# Patient Record
Sex: Male | Born: 1990 | Race: White | Hispanic: No | Marital: Married | Smoking: Never smoker
Health system: Southern US, Community
[De-identification: ages and names within clinical notes are randomized; demographics above are authoritative.]

## PROBLEM LIST (undated history)

## (undated) DIAGNOSIS — I10 Essential (primary) hypertension: Secondary | ICD-10-CM

## (undated) HISTORY — DX: Essential (primary) hypertension: I10

---

## 2009-04-07 ENCOUNTER — Emergency Department: Payer: Self-pay | Admitting: Emergency Medicine

## 2009-12-14 IMAGING — CT CT STONE STUDY
1 of 2 series · 15 of 32 positions shown, 19 images · non-contrast
Comparison: none

REASON FOR EXAM: R mid abdominal pain
COMMENTS:

[Series 2: stone · axial · 0.65mm/px · z∈[+301,+718]mm · 15 of 157 slices shown, 19 images]
[im 12/157  soft-tissue]
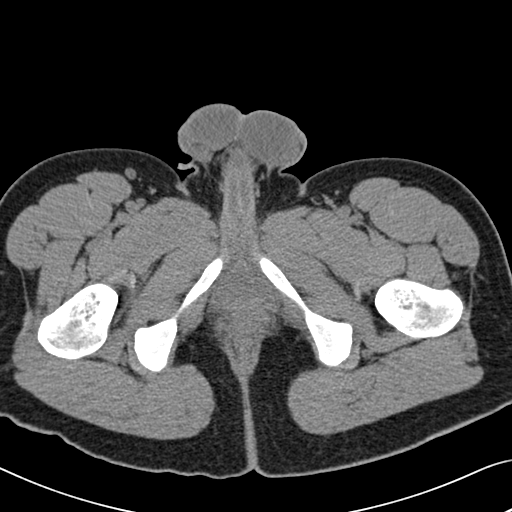
[im 12/157  bone]
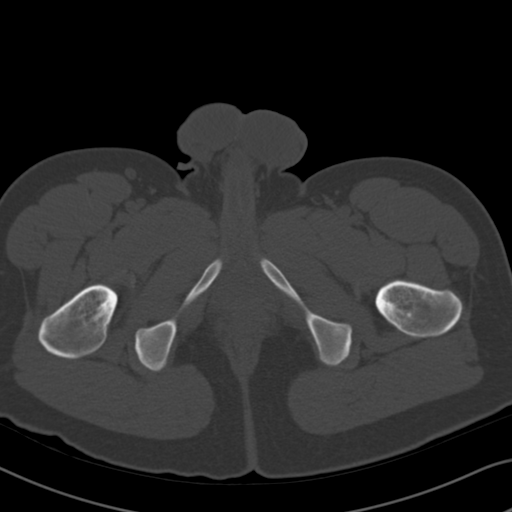
[im 23/157  soft-tissue]
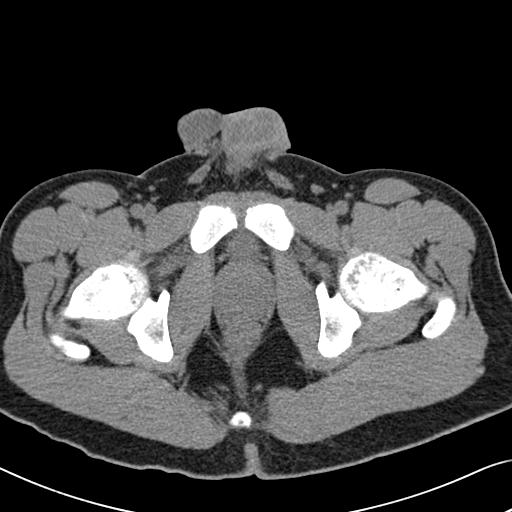
[im 34/157  soft-tissue]
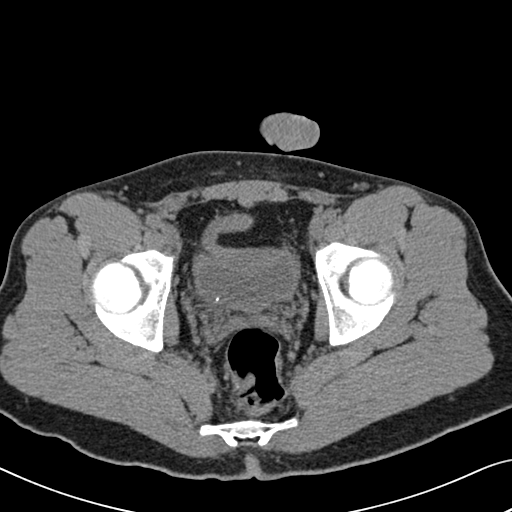
[im 45/157  soft-tissue]
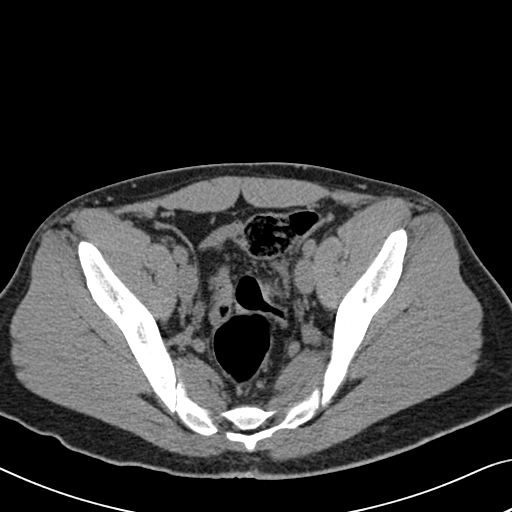
[im 56/157  soft-tissue]
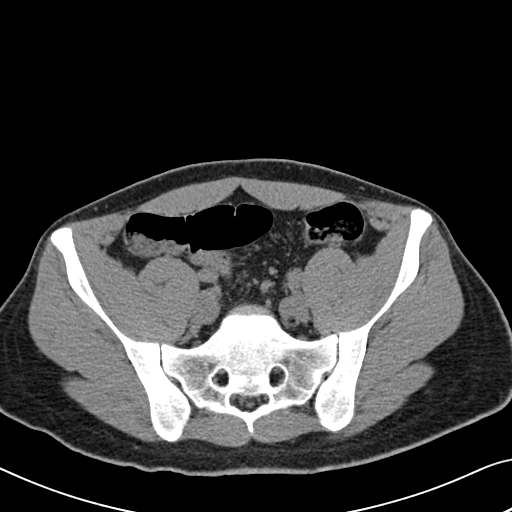
[im 67/157  soft-tissue]
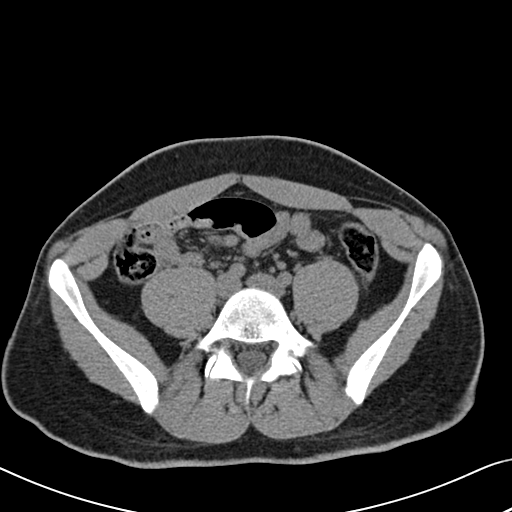
[im 79/157  soft-tissue]
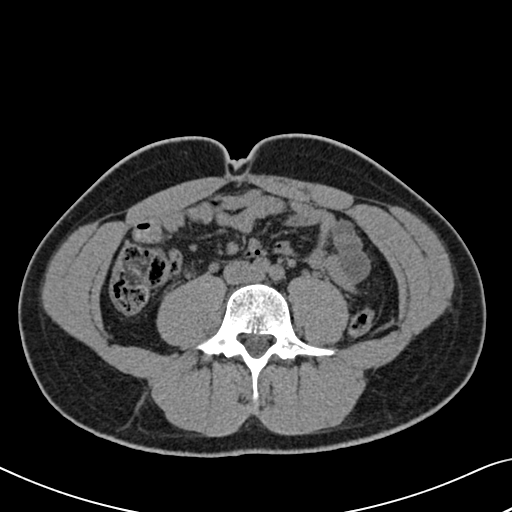
[im 90/157  soft-tissue]
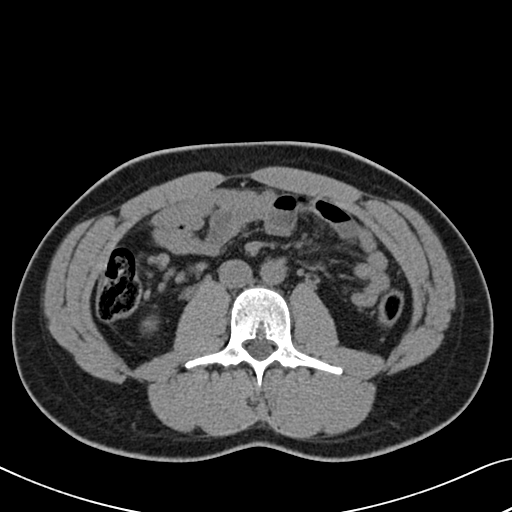
[im 101/157  soft-tissue]
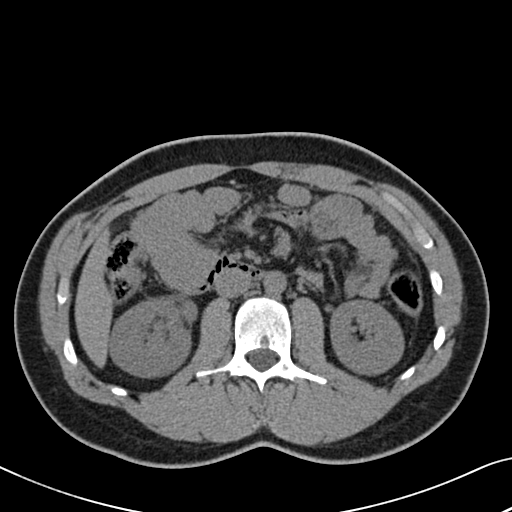
[im 101/157  bone]
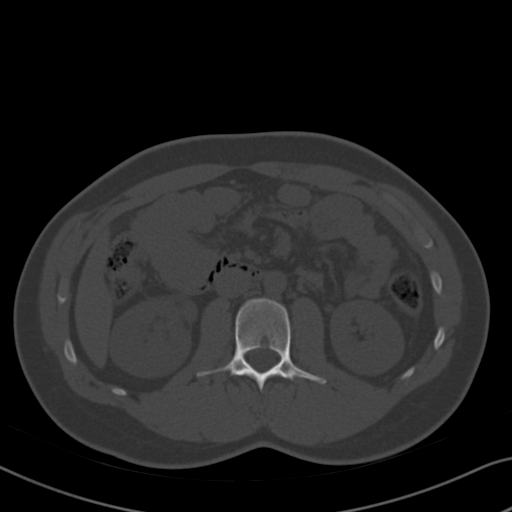
[im 112/157  soft-tissue]
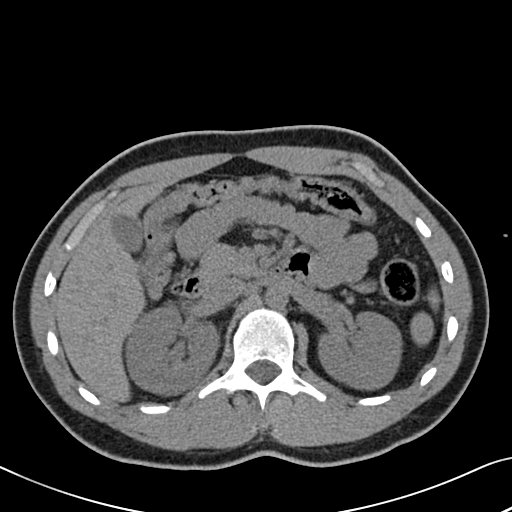
[im 123/157  soft-tissue]
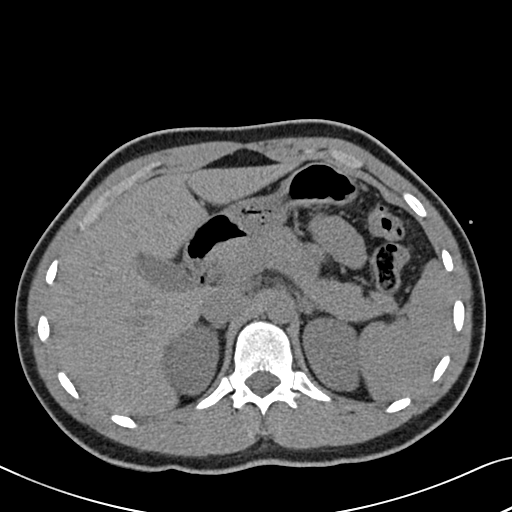
[im 134/157  soft-tissue]
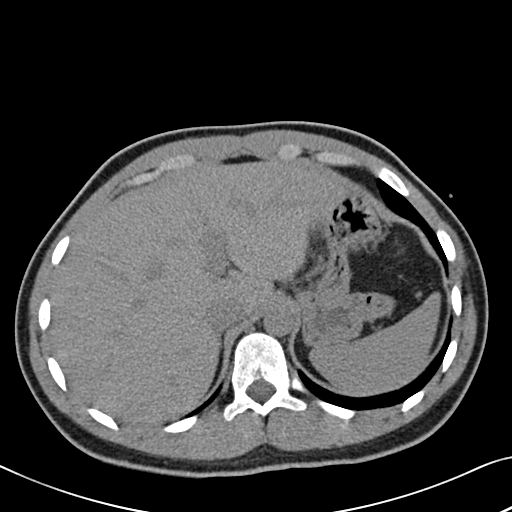
[im 134/157  lung]
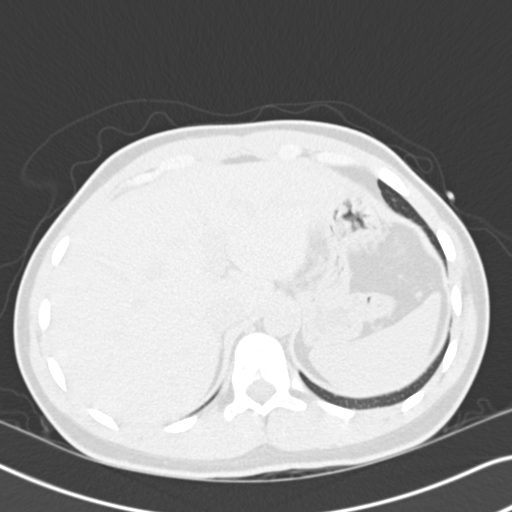
[im 140/157  lung]
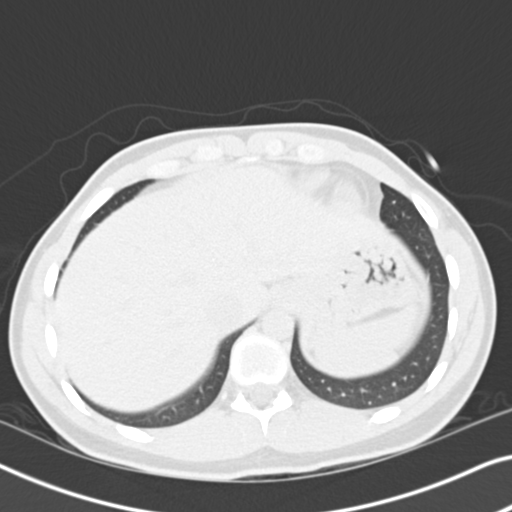
[im 145/157  soft-tissue]
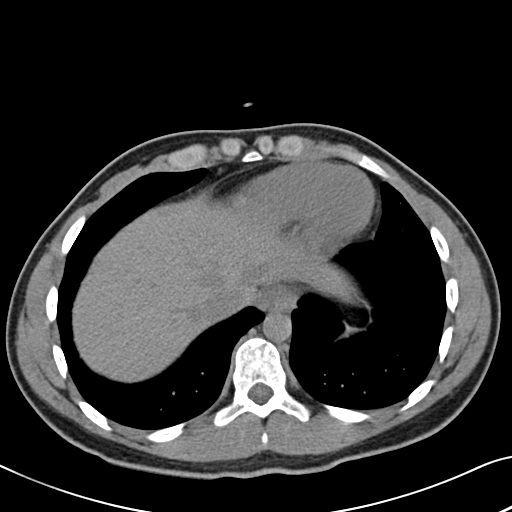
[im 145/157  lung]
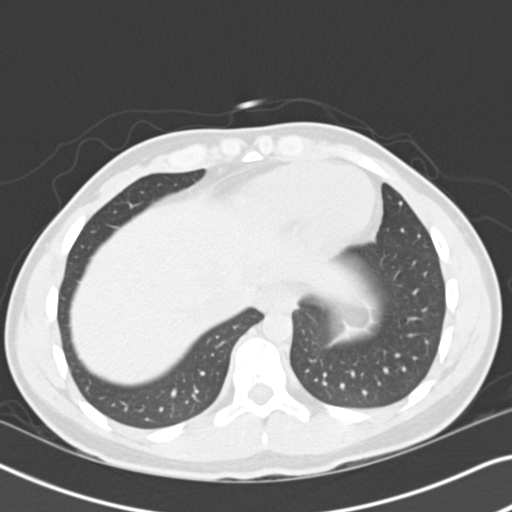
[im 151/157  lung]
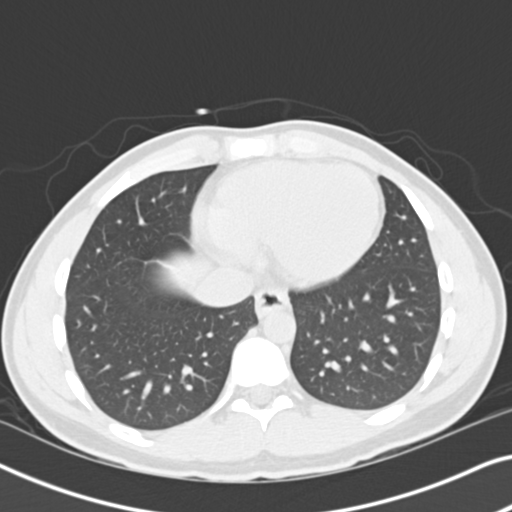

[15 of 32 positions shown; findings below may reference images not displayed]

PROCEDURE:     CT  - CT ABDOMEN /PELVIS WO (STONE)  - April 07, 2009 [DATE]

RESULT:     Helical 3 mm sections were obtained from the lung bases through
the pubic symphysis without the administration of oral or intravenous
contrast per a renal colic protocol.

Evaluation of the lung bases demonstrates no gross abnormalities.

Non-contrast evaluation of the liver, spleen, adrenals, pancreas and LEFT
kidney are unremarkable. There is mild pelviectasis involving the RIGHT
kidney. Mild hydroureter is appreciated to the level of the urinary bladder.
A 2 mm calculus is appreciated just distal to the ureterovesical junction or
possibly within the most distal portion of the ureterovesical junction on
the RIGHT. These findings are consistent with a previous calculus within the
RIGHT system. There is no CT evidence of bowel obstruction nor secondary
signs of enteritis, colitis or diverticulitis nor an abdominal aortic
aneurysm.
IMPRESSION: 1. Findings consistent with a RIGHT renal calculus which is likely just
distal to the ureterovesical junction on the RIGHT or possibly within the
most distal portion of the junction. There is minimal residual obstructive
uropathy.

Dr. Awa of the Emergency Department was informed of these findings at
the time of the initial interpretation.

## 2016-05-08 DIAGNOSIS — I10 Essential (primary) hypertension: Secondary | ICD-10-CM | POA: Insufficient documentation

## 2016-09-04 ENCOUNTER — Encounter: Payer: Self-pay | Admitting: Interventional Cardiology

## 2016-09-19 ENCOUNTER — Ambulatory Visit: Payer: Self-pay | Admitting: Interventional Cardiology

## 2016-09-25 ENCOUNTER — Encounter: Payer: Self-pay | Admitting: Interventional Cardiology

## 2016-09-27 ENCOUNTER — Encounter: Payer: Self-pay | Admitting: Interventional Cardiology

## 2016-10-22 DIAGNOSIS — E782 Mixed hyperlipidemia: Secondary | ICD-10-CM | POA: Insufficient documentation

## 2016-12-04 DIAGNOSIS — R748 Abnormal levels of other serum enzymes: Secondary | ICD-10-CM | POA: Insufficient documentation

## 2018-08-04 ENCOUNTER — Encounter: Payer: Self-pay | Admitting: Emergency Medicine

## 2018-08-04 ENCOUNTER — Emergency Department
Admission: EM | Admit: 2018-08-04 | Discharge: 2018-08-05 | Disposition: A | Discharge status: Home / Self Care | Payer: BC Managed Care – PPO | Attending: Student in an Organized Health Care Education/Training Program | Admitting: Student in an Organized Health Care Education/Training Program

## 2018-08-04 ENCOUNTER — Other Ambulatory Visit: Payer: Self-pay

## 2018-08-04 DIAGNOSIS — R002 Palpitations: Secondary | ICD-10-CM | POA: Diagnosis present

## 2018-08-04 DIAGNOSIS — I10 Essential (primary) hypertension: Secondary | ICD-10-CM | POA: Insufficient documentation

## 2018-08-04 LAB — CBC
HCT: 46 % (ref 40.0–52.0)
Hemoglobin: 16.8 g/dL (ref 13.0–18.0)
MCH: 35.3 pg — ABNORMAL HIGH (ref 26.0–34.0)
MCHC: 36.6 g/dL — AB (ref 32.0–36.0)
MCV: 96.6 fL (ref 80.0–100.0)
Platelets: 258 10*3/uL (ref 150–440)
RBC: 4.76 MIL/uL (ref 4.40–5.90)
RDW: 12.3 % (ref 11.5–14.5)
WBC: 8.3 10*3/uL (ref 3.8–10.6)

## 2018-08-04 LAB — BASIC METABOLIC PANEL
Anion gap: 9 (ref 5–15)
BUN: 10 mg/dL (ref 6–20)
CO2: 31 mmol/L (ref 22–32)
CREATININE: 0.96 mg/dL (ref 0.61–1.24)
Calcium: 10.3 mg/dL (ref 8.9–10.3)
Chloride: 98 mmol/L (ref 98–111)
GFR calc Af Amer: >60 mL/min (ref >60)
Glucose, Bld: 104 mg/dL — ABNORMAL HIGH (ref 70–99)
Potassium: 4.8 mmol/L (ref 3.5–5.1)
Sodium: 138 mmol/L (ref 135–145)

## 2018-08-04 LAB — FIBRIN DERIVATIVES D-DIMER (ARMC ONLY): FIBRIN DERIVATIVES D-DIMER (ARMC): 239.24 ng{FEU}/mL (ref 0.00–499.00)

## 2018-08-04 LAB — TSH: TSH: 4.168 u[IU]/mL (ref 0.350–4.500)

## 2018-08-04 LAB — TROPONIN I: Troponin I: <0.03 ng/mL (ref <0.03)

## 2018-08-04 NOTE — Discharge Instructions (Addendum)
Please follow-up with your doctor.  I have also given you a referral to cardiology, which could be of benefit if you have additional episodes of tachycardia and or symptomatic palpitations.  Return if you have any worsening symptoms or for any additional questions or concerns.

## 2018-08-04 NOTE — ED Triage Notes (Addendum)
Pt presents to ED with intermittent tachycardia for the past 3 days. pt states he has HTN and feels like his blood pressure is up as well due to "feeling thumping" in his head. Denies headache or dizziness. Hx of the same. Symptoms not improving and pt states he was feeling concerned. Pt states he feels chest pressure but can usually pop his back and feel better.

## 2018-08-04 NOTE — ED Provider Notes (Signed)
[facility] Emergency Department Provider Note    First MD Initiated Contact with Patient 08/04/18 2228     (approximate)  I have reviewed the triage vital signs and the nursing notes.   HISTORY  Chief Complaint Tachycardia and Hypertension    HPI Jose Kent is a 27 y.o. male presents to the ER with chief complaint of intermittent episodes where he starts feeling palpitations throbbing sensation in his head as well as sweats.  States he is also having episodes where his wife will wake him up in the middle at night and will be very angry but will not remember the next day.  States he is been very stressed over the past 8 months after the birth of the child.  Denies any chest pains.  No lower extremity swelling.  He is very active and does cardio daily.  Does not want anything for anxiety.  No new medications.  Does have family history of DVT as well as thyroid disorder.    Past Medical History:  Diagnosis Date  . Hypertension    History reviewed. No pertinent family history. History reviewed. No pertinent surgical history. There are no active problems to display for this patient.     Prior to Admission medications   Medication Sig Start Date End Date Taking? Authorizing Provider  lisinopril-hydrochlorothiazide (PRINZIDE,ZESTORETIC) 10-12.5 MG tablet Take 1 tablet by mouth daily. 07/30/18  Yes [provider]    Allergies Patient has no known allergies.    Social History Social History   Tobacco Use  . Smoking status: Never Smoker  . Smokeless tobacco: Never Used  Substance Use Topics  . Alcohol use: Yes  . Drug use: No    Review of Systems Patient denies headaches, rhinorrhea, blurry vision, numbness, shortness of breath, chest pain, edema, cough, abdominal pain, nausea, vomiting, diarrhea, dysuria, fevers, rashes or hallucinations unless otherwise stated above in  HPI. ____________________________________________   PHYSICAL EXAM:  VITAL SIGNS: Vitals:   08/04/18 2118 08/04/18 2230  BP: 130/87 128/84  Pulse: (!) 113 99  Resp: 20 17  Temp: 98.6 F (37 C)   SpO2: 100% 99%    Constitutional: Alert and oriented.  Eyes: Conjunctivae are normal.  Head: Atraumatic. Nose: No congestion/rhinnorhea. Mouth/Throat: Mucous membranes are moist.   Neck: No stridor. Painless ROM.  Cardiovascular: Normal rate, regular rhythm. Grossly normal heart sounds.  Good peripheral circulation. Respiratory: Normal respiratory effort.  No retractions. Lungs CTAB. Gastrointestinal: Soft and nontender. No distention. No abdominal bruits. No CVA tenderness. Genitourinary:  Musculoskeletal: No lower extremity tenderness nor edema.  No joint effusions. Neurologic:  Normal speech and language. No gross focal neurologic deficits are appreciated. No facial droop Skin:  Skin is warm, dry and intact. No rash noted. Psychiatric: Mood and affect are normal. Speech and behavior are normal.  ____________________________________________   LABS (all labs ordered are listed, but only abnormal results are displayed)  Results for orders placed or performed during the hospital encounter of 08/04/18 (from the past 24 hour(s))  Basic metabolic panel     Status: Abnormal   Collection Time: 08/04/18  9:23 PM  Result Value Ref Range   Sodium 138 135 - 145 mmol/L   Potassium 4.8 3.5 - 5.1 mmol/L   Chloride 98 98 - 111 mmol/L   CO2 31 22 - 32 mmol/L   Glucose, Bld 104 (H) 70 - 99 mg/dL   BUN 10 6 - 20 mg/dL   Creatinine, Ser 4.09 0.61 - 1.24  mg/dL   Calcium 04.510.3 8.9 - 40.910.3 mg/dL   GFR calc non Af Amer >60 >60 mL/min   GFR calc Af Amer >60 >60 mL/min   Anion gap 9 5 - 15  CBC     Status: Abnormal   Collection Time: 08/04/18  9:23 PM  Result Value Ref Range   WBC 8.3 3.8 - 10.6 K/uL   RBC 4.76 4.40 - 5.90 MIL/uL   Hemoglobin 16.8 13.0 - 18.0 g/dL   HCT 81.146.0 91.440.0 - 78.252.0 %    MCV 96.6 80.0 - 100.0 fL   MCH 35.3 (H) 26.0 - 34.0 pg   MCHC 36.6 (H) 32.0 - 36.0 g/dL   RDW 95.612.3 21.311.5 - 08.614.5 %   Platelets 258 150 - 440 K/uL  Troponin I     Status: None   Collection Time: 08/04/18  9:23 PM  Result Value Ref Range   Troponin I <0.03 <0.03 ng/mL  Fibrin derivatives D-Dimer ([facility] only)     Status: None   Collection Time: 08/04/18 10:53 PM  Result Value Ref Range   Fibrin derivatives D-dimer ([facility]) 239.24 0.00 - 499.00 ng/mL (FEU)  TSH     Status: None   Collection Time: 08/04/18 10:53 PM  Result Value Ref Range   TSH 4.168 0.350 - 4.500 uIU/mL   ____________________________________________  EKG My review and personal interpretation at Time: 21:16   Indication: palpitations  Rate: 106  Rhythm: sinus Axis: normal  Other: normal intervals, no wpw or brugada ____________________________________________  RADIOLOGY   ____________________________________________   PROCEDURES  Procedure(s) performed:  Procedures    Critical Care performed: no ____________________________________________   INITIAL IMPRESSION / ASSESSMENT AND PLAN / ED COURSE  Pertinent labs & imaging results that were available during my care of the patient were reviewed by me and considered in my medical decision making (see chart for details).   DDX: Dysrhythmia, dehydration, lecture light abnormality, anemia, hyperthyroid, PE, anxiety  Jose Kent is a 27 y.o. who presents to the ED with symptoms as described above.  Blood work is reassuring.  No evidence of hyperthyroid electrolyte or anemia.  EKG shows no evidence of preexcitation syndrome.  Patient low risk by Wells criteria and his d-dimer is negative.  Given his presentation I do believe he stable and appropriate for discharge home.  Clinical Course as of Aug 04 2342  Tue Aug 04, 2018  2234 EKG 12-Lead [PR]    Clinical Course User Index [PR] Willy Eddyobinson, Yasin Ducat, MD     As part of my medical decision making, I  reviewed the following data within the electronic MEDICAL RECORD NUMBER Nursing notes reviewed and incorporated, Labs reviewed, notes from prior ED visits and [place] Controlled Substance Database   ____________________________________________   FINAL CLINICAL IMPRESSION(S) / ED DIAGNOSES  Final diagnoses:  Palpitations      NEW MEDICATIONS STARTED DURING THIS VISIT:  New Prescriptions   No medications on file     Note:  This document was prepared using Dragon voice recognition software and may include unintentional dictation errors.    Willy Eddyobinson, Elleigh Cassetta, MD 08/04/18 [phone]

## 2019-04-15 ENCOUNTER — Telehealth (HOSPITAL_COMMUNITY): Payer: Self-pay | Admitting: Licensed Clinical Social Worker

## 2019-04-15 NOTE — Telephone Encounter (Signed)
PT called at 1PM while counselor was in CD-IOP. Counselor returned call and PT did not answer. Counselor left message asking for a return call tomorrow. PT called to inquire about "substance abuse counseling".

## 2019-06-28 DIAGNOSIS — F331 Major depressive disorder, recurrent, moderate: Secondary | ICD-10-CM | POA: Insufficient documentation

## 2020-02-10 DIAGNOSIS — J310 Chronic rhinitis: Secondary | ICD-10-CM | POA: Insufficient documentation

## 2020-03-29 ENCOUNTER — Ambulatory Visit: Payer: BC Managed Care – PPO | Attending: Otolaryngology

## 2024-08-31 ENCOUNTER — Ambulatory Visit: Payer: Self-pay | Admitting: Family Medicine

## 2024-08-31 ENCOUNTER — Encounter: Payer: Self-pay | Admitting: Family Medicine

## 2024-08-31 VITALS — BP 119/78 | HR 70 | Ht 69.09 in | Wt 145.0 lb

## 2024-08-31 DIAGNOSIS — Z23 Encounter for immunization: Secondary | ICD-10-CM

## 2024-08-31 DIAGNOSIS — E782 Mixed hyperlipidemia: Secondary | ICD-10-CM

## 2024-08-31 DIAGNOSIS — F419 Anxiety disorder, unspecified: Secondary | ICD-10-CM

## 2024-08-31 DIAGNOSIS — Z0001 Encounter for general adult medical examination with abnormal findings: Secondary | ICD-10-CM | POA: Diagnosis not present

## 2024-08-31 DIAGNOSIS — S6991XA Unspecified injury of right wrist, hand and finger(s), initial encounter: Secondary | ICD-10-CM

## 2024-08-31 DIAGNOSIS — F331 Major depressive disorder, recurrent, moderate: Secondary | ICD-10-CM

## 2024-08-31 DIAGNOSIS — Z136 Encounter for screening for cardiovascular disorders: Secondary | ICD-10-CM

## 2024-08-31 DIAGNOSIS — Z130 Encounter for screening for diseases of the blood and blood-forming organs and certain disorders involving the immune mechanism: Secondary | ICD-10-CM

## 2024-08-31 DIAGNOSIS — Z0000 Encounter for general adult medical examination without abnormal findings: Secondary | ICD-10-CM

## 2024-08-31 MED ORDER — ESCITALOPRAM OXALATE 10 MG PO TABS: 10.0000 mg | ORAL_TABLET | Freq: Every day | ORAL | 1 refills | Status: AC

## 2024-08-31 NOTE — Progress Notes (Signed)
 New patient visit   Patient: Jose Kent   DOB: [birth year]   33 y.o. Male  MRN: [id number] Visit Date: 08/31/2024  Today's healthcare provider: LAURAINE LOISE BUOY, DO   Chief Complaint  Patient presents with   New Patient (Initial Visit)    No concerns    Subjective    Jose Kent is a 33 y.o. male who presents today as a new patient to establish care.  HPI HPI     New Patient (Initial Visit)    Additional comments: No concerns       Last edited by Thelbert Eulalio HERO, CMA on 08/31/2024  9:11 AM.       Jose Kent is a 33 year old male who presents with anxiety and depression.  He has a history of anxiety and depression, previously managed with sertraline, which he discontinued due to experiencing 'brain zaps'. Since stopping the medication, he has managed without it, but symptoms have recurred. Over the past two weeks, he feels 'down, depressed, or hopeless' most days and has trouble falling asleep. His wife has noticed a lack of enthusiasm and that he seems 'out of it'. He experiences panic attacks about twice a year, if that.  In the last week, they have started ascending, but he acknowledges that the symptoms started prior to that.  He has a history of high blood pressure and high cholesterol, neither of which are currently being treated with medication. He previously managed high cholesterol with dietary changes. He lost a significant amount of weight after ceasing alcohol consumption, attributing this to the high caloric content of alcohol.  He sustained a wrist injury from a fall on his outstretched hand at the beginning of the summer, approximately two months ago, which continues to cause pain, particularly during push-ups or dips. The pain is localized to the medial proximal palm/wrist.  He has a history of elevated liver enzymes and attended a rehabilitation facility four years ago.  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     Past Medical History:  Diagnosis Date   Essential hypertension 05/08/2016   Improved off meds post wt loss     Hypertension    History reviewed. No pertinent surgical history. Family Status  Relation Name Status   Mother  Alive   Father  Alive   Brother  Alive  No partnership data on file   History reviewed. No pertinent family history. Social History   Socioeconomic History   Marital status: Married    Spouse name: Not on file   Number of children: 0   Years of education: COLLEGE   Highest education level: Not on file  Occupational History   Occupation: TEACHER  Tobacco Use   Smoking status: Never   Smokeless tobacco: Never  Vaping Use   Vaping status: Never Used  Substance and Sexual Activity   Alcohol use: Yes   Drug use: No   Sexual activity: Yes  Other Topics Concern   Not on file  Social History Narrative   Not on file   Social Drivers of Health   Financial Resource Strain: Low Risk  (08/31/2024)   Overall Financial Resource Strain (CARDIA)    Difficulty of Paying Living Expenses: Not hard at all  Food Insecurity: No Food Insecurity (08/31/2024)   Hunger Vital Sign    Worried About Running Out of Food in the Last Year: Never true  Ran Out of Food in the Last Year: Never true  Transportation Needs: No Transportation Needs (08/31/2024)   PRAPARE - Administrator, Civil Service (Medical): No    Lack of Transportation (Non-Medical): No  Physical Activity: Not on file  Stress: Stress Concern Present (08/31/2024)   [employer] of Occupational Health - Occupational Stress Questionnaire    Feeling of Stress: To some extent  Social Connections: Not on file   Outpatient Medications Prior to Visit  Medication Sig Note   [DISCONTINUED] lisinopril-hydrochlorothiazide (PRINZIDE,ZESTORETIC) 10-12.5 MG tablet Take 1 tablet by mouth daily. (Patient not taking: Reported on 08/31/2024)  08/31/2024: BP controlled s/p weight loss   No facility-administered medications prior to visit.   No Known Allergies  Immunization History  Administered Date(s) Administered   Influenza, Seasonal, Injecte, Preservative Fre 08/31/2024   Influenza-Unspecified 11/03/2017, 09/25/2023   PFIZER Comirnaty(Gray Top)Covid-19 Tri-Sucrose Vaccine 01/31/2020, 02/21/2020, 12/28/2020   PPD Test 10/11/2022   Tdap 04/06/2016, 11/03/2017    Health Maintenance  Topic Date Due   HPV VACCINES (1 - 3-dose SCDM series) 08/31/2025 (Originally 10/27/2016)   COVID-19 Vaccine (7 - 2025-26 season) 09/16/2025 (Originally 08/30/2024)   DTaP/Tdap/Td (3 - Td or Tdap) 11/04/2027   INFLUENZA VACCINE  Completed   Pneumococcal Vaccine  Aged Out   Meningococcal B Vaccine  Aged Out   Hepatitis B Vaccines 19-59 Average Risk  Discontinued   Hepatitis C Screening  Discontinued   HIV Screening  Discontinued    Patient Care Team: Donzella Lauraine SAILOR, DO as PCP - General (Family Medicine)  Review of Systems  Constitutional:  Negative for appetite change, chills, fatigue and fever.  HENT:  Negative for congestion, ear pain, hearing loss, nosebleeds and trouble swallowing.   Eyes:  Negative for pain and visual disturbance.  Respiratory:  Negative for cough, chest tightness and shortness of breath.   Cardiovascular:  Negative for chest pain, palpitations and leg swelling.  Gastrointestinal:  Negative for abdominal pain, blood in stool, constipation, diarrhea, nausea and vomiting.  Endocrine: Negative for polydipsia, polyphagia and polyuria.  Genitourinary:  Negative for dysuria and flank pain.  Musculoskeletal:  Positive for arthralgias (right wrist pain s/p FOOSH injury 2 months ago). Negative for back pain, joint swelling, myalgias and neck stiffness.  Skin:  Negative for color change, rash and wound.  Neurological:  Negative for dizziness, tremors, seizures, speech difficulty, weakness, light-headedness and headaches.   Psychiatric/Behavioral:  Negative for behavioral problems, confusion, decreased concentration, dysphoric mood and sleep disturbance. The patient is not nervous/anxious.   All other systems reviewed and are negative.       Objective    BP 119/78 (BP Location: Left Arm, Patient Position: Sitting, Cuff Size: Normal)   Pulse 70   Ht 5' 9.09 (1.755 m)   Wt 145 lb (65.8 kg)   SpO2 100%   BMI 21.35 kg/m     Physical Exam Vitals and nursing note reviewed.  Constitutional:      General: He is awake.     Appearance: Normal appearance.  HENT:     Head: Normocephalic and atraumatic.     Right Ear: Tympanic membrane, ear canal and external ear normal.     Left Ear: Tympanic membrane, ear canal and external ear normal.     Nose: Nose normal.     Mouth/Throat:     Mouth: Mucous membranes are moist.     Pharynx: Oropharynx is clear. No oropharyngeal exudate or posterior oropharyngeal erythema.  Eyes:  General: No scleral icterus.    Extraocular Movements: Extraocular movements intact.     Conjunctiva/sclera: Conjunctivae normal.     Pupils: Pupils are equal, round, and reactive to light.  Neck:     Thyroid : No thyromegaly or thyroid  tenderness.  Cardiovascular:     Rate and Rhythm: Normal rate and regular rhythm.     Pulses: Normal pulses.     Heart sounds: Normal heart sounds.  Pulmonary:     Effort: Pulmonary effort is normal. No tachypnea, bradypnea or respiratory distress.     Breath sounds: Normal breath sounds. No stridor. No wheezing, rhonchi or rales.  Abdominal:     General: Bowel sounds are normal. There is no distension.     Palpations: Abdomen is soft. There is no mass.     Tenderness: There is no abdominal tenderness. There is no guarding.     Hernia: No hernia is present.  Musculoskeletal:       Arms:     Cervical back: Normal range of motion and neck supple.     Right lower leg: No edema.     Left lower leg: No edema.     Comments: Pain in wrist/proximal  hand approximately overlying the lunate bone  Lymphadenopathy:     Cervical: No cervical adenopathy.  Skin:    General: Skin is warm and dry.  Neurological:     Mental Status: He is alert and oriented to person, place, and time. Mental status is at baseline.  Psychiatric:        Mood and Affect: Mood normal.        Behavior: Behavior normal.     Depression Screen    08/31/2024    9:25 AM  PHQ 2/9 Scores  PHQ - 2 Score 6  PHQ- 9 Score 15   No results found for any visits on 08/31/24.  Assessment & Plan     Encounter for medical examination to establish care  Recurrent moderate major depressive disorder with anxiety (HCC) -     Escitalopram  Oxalate; Take 1 tablet (10 mg total) by mouth daily.  Dispense: 30 tablet; Refill: 1  Right wrist injury, initial encounter -     DG Wrist Complete Right; Future  Moderate mixed hyperlipidemia not requiring statin therapy  Flu vaccine need -     Flu vaccine trivalent PF, 6mos and older(Flulaval,Afluria,Fluarix,Fluzone)  Screening for endocrine, metabolic and immunity disorder -     Comprehensive metabolic panel with GFR  Encounter for screening for cardiovascular disorders -     Lipid panel     Encounter for medical examination to establish care Physical exam overall unremarkable except as noted above. Routine lab work ordered as noted.  Immunization status reviewed. Discussed flu shot, COVID booster, and hepatitis B vaccination. HPV vaccination discussed but declined through shared-decision making. - Administer flu vaccine. - Discuss COVID booster eligibility. - Review immunization records for hepatitis B. - Discuss HPV vaccination eligibility.  Right wrist pain, status post FOOSH injury Chronic localized medial right wrist pain post-fall, worsened by weight-bearing activities. - Order x[facility] of right wrist at [facility].  Depression and anxiety, recurrent Recurrent depression and anxiety with sleep issues  and panic attacks. Sertraline not tolerated. Lexapro  and citalopram discussed as alternatives. - Prescribe Lexapro  (escitalopram ). - Schedule follow-up in six weeks to assess response to medication. - Advise to report any adverse effects of Lexapro  immediately to allow switch to alternative.  Mixed hyperlipidemia Hyperlipidemia managed with diet. Previous records show  high cholesterol.  Patient lost significant amount of weight after stopping alcohol use. - Continue dietary management of hyperlipidemia.    Return in about 6 weeks (around 10/12/2024) for Anx/Dep, and in 1-year for CPE.     I discussed the assessment and treatment plan with the patient  The patient was provided an opportunity to ask questions and all were answered. The patient agreed with the plan and demonstrated an understanding of the instructions.   The patient was advised to call back or seek an in-person evaluation if the symptoms worsen or if the condition fails to improve as anticipated.    LAURAINE LOISE BUOY, DO  [facility] [phone] (phone) [phone] (fax)  [facility]

## 2024-09-01 LAB — COMPREHENSIVE METABOLIC PANEL WITH GFR
ALT: 17 IU/L (ref 0–44)
AST: 19 IU/L (ref 0–40)
Albumin: 4.9 g/dL (ref 4.1–5.1)
Alkaline Phosphatase: 79 IU/L (ref 44–121)
BUN/Creatinine Ratio: 10 (ref 9–20)
BUN: 9 mg/dL (ref 6–20)
Bilirubin Total: 0.6 mg/dL (ref 0.0–1.2)
CO2: 22 mmol/L (ref 20–29)
Calcium: 9.8 mg/dL (ref 8.7–10.2)
Chloride: 103 mmol/L (ref 96–106)
Creatinine, Ser: 0.92 mg/dL (ref 0.76–1.27)
Globulin, Total: 2.4 g/dL (ref 1.5–4.5)
Glucose: 88 mg/dL (ref 70–99)
Potassium: 4.2 mmol/L (ref 3.5–5.2)
Sodium: 141 mmol/L (ref 134–144)
Total Protein: 7.3 g/dL (ref 6.0–8.5)
eGFR: 112 mL/min/1.73 (ref >59)

## 2024-09-01 LAB — LIPID PANEL
Chol/HDL Ratio: 3.6 ratio (ref 0.0–5.0)
Cholesterol, Total: 154 mg/dL (ref 100–199)
HDL: 43 mg/dL (ref >39)
LDL Chol Calc (NIH): 97 mg/dL (ref 0–99)
Triglycerides: 69 mg/dL (ref 0–149)
VLDL Cholesterol Cal: 14 mg/dL (ref 5–40)

## 2024-09-09 ENCOUNTER — Ambulatory Visit: Payer: Self-pay | Admitting: Family Medicine

## 2024-10-12 ENCOUNTER — Ambulatory Visit: Admitting: Family Medicine
